# Patient Record
Sex: Female | Born: 1985 | Hispanic: No | Marital: Married | Smoking: Never smoker
Health system: Southern US, Community
[De-identification: ages and names within clinical notes are randomized; demographics above are authoritative.]

## PROBLEM LIST (undated history)

## (undated) ENCOUNTER — Inpatient Hospital Stay (HOSPITAL_COMMUNITY): Payer: Self-pay

## (undated) DIAGNOSIS — O468X9 Other antepartum hemorrhage, unspecified trimester: Secondary | ICD-10-CM

## (undated) DIAGNOSIS — D649 Anemia, unspecified: Secondary | ICD-10-CM

## (undated) DIAGNOSIS — F419 Anxiety disorder, unspecified: Secondary | ICD-10-CM

## (undated) DIAGNOSIS — O418X90 Other specified disorders of amniotic fluid and membranes, unspecified trimester, not applicable or unspecified: Secondary | ICD-10-CM

## (undated) HISTORY — PX: NO PAST SURGERIES: SHX2092

## (undated) HISTORY — DX: Anxiety disorder, unspecified: F41.9

## (undated) HISTORY — PX: WISDOM TOOTH EXTRACTION: SHX21

---
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
## 2004-10-17 ENCOUNTER — Other Ambulatory Visit: Admission: RE | Admit: 2004-10-17 | Discharge: 2004-10-17 | Payer: Self-pay | Admitting: Gynecology

## 2006-02-04 ENCOUNTER — Other Ambulatory Visit: Admission: RE | Admit: 2006-02-04 | Discharge: 2006-02-04 | Payer: Self-pay | Admitting: Gynecology

## 2007-02-10 ENCOUNTER — Other Ambulatory Visit: Admission: RE | Admit: 2007-02-10 | Discharge: 2007-02-10 | Payer: Self-pay | Admitting: Gynecology

## 2009-03-09 ENCOUNTER — Other Ambulatory Visit: Admission: RE | Admit: 2009-03-09 | Discharge: 2009-03-09 | Payer: Self-pay | Admitting: Gynecology

## 2009-03-09 ENCOUNTER — Ambulatory Visit: Payer: Self-pay | Admitting: Gynecology

## 2012-09-29 NOTE — L&D Delivery Note (Signed)
Delivery Note  I arrived to BS at about 2030 and pt began pushing initially in R lateral, FHR tachycardic but overall remained reassuring, Pt then moved to lithotomy position and continued pushing well.     At 9:18 PM a viable female was delivered via Vaginal, Spontaneous Delivery (Presentation: Left Occiput Anterior).  There was a compound presentation of of both hands, anterior arm was delivered then anterior shoulder, then posterior arm delivered and rest of infant delivered without difficulty, infant dried and placed on mom's abdomen, cord doubly clamped and cut by FOB, APGAR: 6, 9; weight 8 lb 3 oz (3715 g).   Placenta status: Intact, Spontaneous.  Cord: 3 vessels with the following complications: None.  Cord pH: n/a   Anesthesia: Epidural  Episiotomy: None Lacerations: 2nd degree Suture Repair: 3.0 vicryl rapide Est. Blood Loss (mL): 300  Mom to postpartum.  Baby to nursery-stable. Infant remains skin-skin on mom Mother and baby stable in delivery room Pt plans to BF Pt desires inpatient circumcision, Dr Dion Body notified  Routine PP orders initiated    Anna Wise 05/21/2013, 12:25 AM

## 2012-10-15 ENCOUNTER — Other Ambulatory Visit: Payer: 59

## 2012-10-15 ENCOUNTER — Ambulatory Visit: Payer: 59

## 2012-10-15 DIAGNOSIS — Z331 Pregnant state, incidental: Secondary | ICD-10-CM

## 2012-10-17 LAB — CULTURE, OB URINE: Colony Count: 3000

## 2012-10-18 LAB — PRENATAL PANEL VII
Antibody Screen: NEGATIVE
Basophils Relative: 0 % (ref 0–1)
Eosinophils Absolute: 0 10*3/uL (ref 0.0–0.7)
HCT: 38.9 % (ref 36.0–46.0)
Hemoglobin: 12.6 g/dL (ref 12.0–15.0)
MCH: 24.9 pg — ABNORMAL LOW (ref 26.0–34.0)
MCHC: 32.4 g/dL (ref 30.0–36.0)
Monocytes Absolute: 0.4 10*3/uL (ref 0.1–1.0)
Monocytes Relative: 7 % (ref 3–12)
Rh Type: POSITIVE

## 2012-10-28 ENCOUNTER — Telehealth: Payer: Self-pay | Admitting: Obstetrics and Gynecology

## 2012-10-28 NOTE — Telephone Encounter (Signed)
Spoke with pt rgd labs informed lab results pt voice understanding 

## 2012-11-13 ENCOUNTER — Other Ambulatory Visit: Payer: Self-pay

## 2012-11-23 DIAGNOSIS — Z91013 Allergy to seafood: Secondary | ICD-10-CM | POA: Insufficient documentation

## 2012-11-24 ENCOUNTER — Other Ambulatory Visit: Payer: Self-pay

## 2012-11-24 ENCOUNTER — Other Ambulatory Visit: Payer: 59

## 2012-11-24 ENCOUNTER — Ambulatory Visit: Payer: 59 | Admitting: Obstetrics and Gynecology

## 2012-11-24 ENCOUNTER — Encounter: Payer: Self-pay | Admitting: Obstetrics and Gynecology

## 2012-11-24 VITALS — BP 120/75 | Ht 67.0 in | Wt 218.0 lb

## 2012-11-24 DIAGNOSIS — Z91013 Allergy to seafood: Secondary | ICD-10-CM

## 2012-11-24 DIAGNOSIS — E039 Hypothyroidism, unspecified: Secondary | ICD-10-CM

## 2012-11-24 DIAGNOSIS — Z331 Pregnant state, incidental: Secondary | ICD-10-CM

## 2012-11-24 DIAGNOSIS — Z124 Encounter for screening for malignant neoplasm of cervix: Secondary | ICD-10-CM

## 2012-11-24 LAB — POCT WET PREP (WET MOUNT): WBC, Wet Prep HPF POC: NEGATIVE

## 2012-11-24 MED ORDER — CONCEPT DHA 53.5-38-1 MG PO CAPS: 1.0000 | ORAL_CAPSULE | Freq: Every day | ORAL | Status: DC

## 2012-11-24 NOTE — Progress Notes (Signed)
[redacted]w[redacted]d Pt declined flu vaccine. Pt declined genetic screenings.  Pap due today

## 2012-11-24 NOTE — Progress Notes (Signed)
   Anna Wise is being seen today for her first obstetrical visit at [redacted]w[redacted]d gestation by LMP.  She reports doing well.  Had one episode this week of feeling dizzy and had blurry vision--thinks her blood sugar was low, but also concerned about family hx of hypothyroidism (mother and maternal grandmother).  Her obstetrical history is significant for: Patient Active Problem List  Diagnosis  . Shellfish allergy    Relationship with FOB:  Husband, Thayer Ohm, involved and supportive.   Feeding plan:   Breast  Pregnancy history fully reviewed.  The following portions of the patient's history were reviewed and updated as appropriate: allergies, current medications, past family history, past medical history, past social history, past surgical history and problem list.  Review of Systems Pertinent ROS is described in HPI   Objective:   Ht 5\' 7"  (1.702 m)  Wt 218 lb (98.884 kg)  BMI 34.14 kg/m2  LMP 09/12/2012 Wt Readings from Last 1 Encounters:  11/24/12 218 lb (98.884 kg)   BMI: Body mass index is 34.14 kg/(m^2).  General: alert, cooperative and no distress HEENT: grossly normal  Thyroid: normal  Respiratory: clear to auscultation bilaterally Cardiovascular: regular rate and rhythm,  Breasts:  No dominant masses, nipples erect Gastrointestinal: soft, non-tender; no masses,  no organomegaly Extremities: extremities normal, no pain or edema Vaginal Bleeding: None  EXTERNAL GENITALIA: normal appearing vulva with no masses, tenderness or lesions VAGINA: no abnormal discharge or lesions CERVIX: no lesions or cervical motion tenderness; cervix closed, long, firm UTERUS: gravid and consistent with 10-11 weeks ADNEXA: no masses palpable and nontender OB EXAM PELVIMETRY: appears adequate  FHR:  160  bpm  Assessment:    Pregnancy at 10 3/7 weeks FHx hypothyroidism  Plan:     Prenatal panel reviewed and discussed with the patient:  yes  Pap smear collected:  yes GC/Chlamydia  collected:  Negative at NOB interview 10/15/12. Wet prep:  Negative Discussion of Genetic testing options: Declines Prenatal vitamins recommended  Plan of care: Next visit:  4 weeks for ROB Other anticipated f/u:   18 week [place] for anatomy. Thyroid panel today with TSH Rx [facility] per patient request--sent to patient's pharmacy.   Nigel Bridgeman, CNM, [place]

## 2012-11-25 ENCOUNTER — Telehealth: Payer: Self-pay

## 2012-11-25 LAB — THYROID PANEL WITH TSH
T3 Uptake: 24.3 % (ref 22.5–37.0)
T4, Total: 9.2 ug/dL (ref 5.0–12.5)
TSH: 1.864 u[IU]/mL (ref 0.350–4.500)

## 2012-11-25 NOTE — Telephone Encounter (Signed)
Message copied by Darien Ramus on Thu Nov 25, 2012  8:53 AM ------      Message from: Cornelius Moras      Created: Thu Nov 25, 2012  7:32 AM       Please call patient and advise of normal thyroid panel      Thanks!      VL ------

## 2012-11-25 NOTE — Telephone Encounter (Signed)
Pt advised  Emmitt Matthews, CMA  

## 2012-11-26 LAB — PAP IG, CT-NG, RFX HPV ASCU
Chlamydia Probe Amp: NEGATIVE
GC Probe Amp: NEGATIVE

## 2012-12-15 ENCOUNTER — Inpatient Hospital Stay (HOSPITAL_COMMUNITY)
Admission: AD | Admit: 2012-12-15 | Discharge: 2012-12-15 | Disposition: A | Discharge status: Home / Self Care | Payer: 59 | Source: Ambulatory Visit | Attending: Obstetrics and Gynecology | Admitting: Obstetrics and Gynecology

## 2012-12-15 ENCOUNTER — Encounter (HOSPITAL_COMMUNITY): Payer: Self-pay | Admitting: *Deleted

## 2012-12-15 ENCOUNTER — Inpatient Hospital Stay (HOSPITAL_COMMUNITY): Payer: 59

## 2012-12-15 DIAGNOSIS — O4692 Antepartum hemorrhage, unspecified, second trimester: Secondary | ICD-10-CM

## 2012-12-15 DIAGNOSIS — O418X90 Other specified disorders of amniotic fluid and membranes, unspecified trimester, not applicable or unspecified: Secondary | ICD-10-CM

## 2012-12-15 DIAGNOSIS — O209 Hemorrhage in early pregnancy, unspecified: Secondary | ICD-10-CM | POA: Insufficient documentation

## 2012-12-15 DIAGNOSIS — O418X20 Other specified disorders of amniotic fluid and membranes, second trimester, not applicable or unspecified: Secondary | ICD-10-CM

## 2012-12-15 DIAGNOSIS — O4690 Antepartum hemorrhage, unspecified, unspecified trimester: Secondary | ICD-10-CM | POA: Diagnosis present

## 2012-12-15 DIAGNOSIS — R109 Unspecified abdominal pain: Secondary | ICD-10-CM | POA: Insufficient documentation

## 2012-12-15 DIAGNOSIS — O468X9 Other antepartum hemorrhage, unspecified trimester: Secondary | ICD-10-CM | POA: Diagnosis present

## 2012-12-15 HISTORY — DX: Other antepartum hemorrhage, unspecified trimester: O46.8X9

## 2012-12-15 HISTORY — DX: Other specified disorders of amniotic fluid and membranes, unspecified trimester, not applicable or unspecified: O41.8X90

## 2012-12-15 LAB — WET PREP, GENITAL: Trich, Wet Prep: NONE SEEN

## 2012-12-15 LAB — URINE MICROSCOPIC-ADD ON

## 2012-12-15 LAB — URINALYSIS, ROUTINE W REFLEX MICROSCOPIC
Glucose, UA: NEGATIVE mg/dL
Leukocytes, UA: NEGATIVE
Protein, ur: NEGATIVE mg/dL
Specific Gravity, Urine: 1.02 (ref 1.005–1.030)

## 2012-12-15 NOTE — MAU Note (Signed)
Pt reports at 1800 she felt something leaking out, noted bright red blood. Pt went to ultrasound upon arrival. Dull cramping now.

## 2012-12-15 NOTE — MAU Note (Signed)
Patient to MAU with bleeding and abdominal cramping. Patient has on a pad with a small amount dark blood on it. Ultrasound is ready and taken to ultrasound.

## 2013-05-19 ENCOUNTER — Inpatient Hospital Stay (HOSPITAL_COMMUNITY)
Admission: AD | Admit: 2013-05-19 | Discharge: 2013-05-22 | DRG: 775 | Disposition: A | Discharge status: Home / Self Care | Payer: 59 | Source: Ambulatory Visit | Attending: Obstetrics and Gynecology | Admitting: Obstetrics and Gynecology

## 2013-05-19 ENCOUNTER — Encounter (HOSPITAL_COMMUNITY): Payer: Self-pay

## 2013-05-19 DIAGNOSIS — O4290 Premature rupture of membranes, unspecified as to length of time between rupture and onset of labor, unspecified weeks of gestation: Secondary | ICD-10-CM | POA: Diagnosis present

## 2013-05-19 DIAGNOSIS — O4210 Premature rupture of membranes, onset of labor more than 24 hours following rupture, unspecified weeks of gestation: Secondary | ICD-10-CM

## 2013-05-19 DIAGNOSIS — O9903 Anemia complicating the puerperium: Secondary | ICD-10-CM | POA: Diagnosis not present

## 2013-05-19 DIAGNOSIS — D649 Anemia, unspecified: Secondary | ICD-10-CM | POA: Diagnosis not present

## 2013-05-19 DIAGNOSIS — O328XX0 Maternal care for other malpresentation of fetus, not applicable or unspecified: Secondary | ICD-10-CM | POA: Diagnosis present

## 2013-05-19 HISTORY — DX: Anemia, unspecified: D64.9

## 2013-05-19 LAB — CBC
HCT: 32.2 % — ABNORMAL LOW (ref 36.0–46.0)
Hemoglobin: 10.2 g/dL — ABNORMAL LOW (ref 12.0–15.0)
MCV: 79.7 fL (ref 78.0–100.0)
RBC: 4.04 MIL/uL (ref 3.87–5.11)
WBC: 9.2 10*3/uL (ref 4.0–10.5)

## 2013-05-19 LAB — TYPE AND SCREEN
ABO/RH(D): B POS
Antibody Screen: NEGATIVE

## 2013-05-19 LAB — ABO/RH: ABO/RH(D): B POS

## 2013-05-19 MED ORDER — LACTATED RINGERS IV SOLN
INTRAVENOUS | Status: DC
  Administered 2013-05-19: 125 mL/h via INTRAVENOUS
  Administered 2013-05-20 (×3): via INTRAVENOUS

## 2013-05-19 MED ORDER — ACETAMINOPHEN 325 MG PO TABS
650.0000 mg | ORAL_TABLET | ORAL | Status: DC | PRN
  Administered 2013-05-20: 650 mg via ORAL
  Filled 2013-05-19: qty 2

## 2013-05-19 MED ORDER — CITRIC ACID-SODIUM CITRATE 334-500 MG/5ML PO SOLN: 30.0000 mL | ORAL | Status: DC | PRN

## 2013-05-19 MED ORDER — IBUPROFEN 600 MG PO TABS
600.0000 mg | ORAL_TABLET | Freq: Four times a day (QID) | ORAL | Status: DC | PRN
  Administered 2013-05-20: 600 mg via ORAL
  Filled 2013-05-19: qty 1

## 2013-05-19 MED ORDER — OXYTOCIN BOLUS FROM INFUSION
500.0000 mL | INTRAVENOUS | Status: DC
  Administered 2013-05-20: 500 mL via INTRAVENOUS

## 2013-05-19 MED ORDER — OXYTOCIN 40 UNITS IN LACTATED RINGERS INFUSION - SIMPLE MED
62.5000 mL/h | INTRAVENOUS | Status: DC
Start: 2013-05-19 — End: 2013-05-21

## 2013-05-19 MED ORDER — OXYCODONE-ACETAMINOPHEN 5-325 MG PO TABS: 1.0000 | ORAL_TABLET | ORAL | Status: DC | PRN

## 2013-05-19 MED ORDER — LIDOCAINE HCL (PF) 1 % IJ SOLN
30.0000 mL | INTRAMUSCULAR | Status: DC | PRN
  Administered 2013-05-20: 30 mL via SUBCUTANEOUS
  Filled 2013-05-19 (×2): qty 30

## 2013-05-19 MED ORDER — MISOPROSTOL 25 MCG QUARTER TABLET
25.0000 ug | ORAL_TABLET | ORAL | Status: DC | PRN
  Administered 2013-05-19 (×2): 25 ug via ORAL
  Filled 2013-05-19: qty 0.25
  Filled 2013-05-19: qty 1
  Filled 2013-05-19: qty 0.25

## 2013-05-19 MED ORDER — SODIUM CHLORIDE 0.9 % IJ SOLN: 3.0000 mL | INTRAMUSCULAR | Status: DC | PRN

## 2013-05-19 MED ORDER — OXYTOCIN 40 UNITS IN LACTATED RINGERS INFUSION - SIMPLE MED
1.0000 m[IU]/min | INTRAVENOUS | Status: DC
  Filled 2013-05-19: qty 1000

## 2013-05-19 MED ORDER — ZOLPIDEM TARTRATE 5 MG PO TABS: 5.0000 mg | ORAL_TABLET | Freq: Every evening | ORAL | Status: DC | PRN

## 2013-05-19 MED ORDER — ONDANSETRON HCL 4 MG/2ML IJ SOLN
4.0000 mg | Freq: Four times a day (QID) | INTRAMUSCULAR | Status: DC | PRN
  Administered 2013-05-20 (×2): 4 mg via INTRAVENOUS
  Filled 2013-05-19 (×2): qty 2

## 2013-05-19 MED ORDER — LACTATED RINGERS IV SOLN
500.0000 mL | INTRAVENOUS | Status: DC | PRN
Start: 2013-05-19 — End: 2013-05-21
  Administered 2013-05-20: 500 mL via INTRAVENOUS

## 2013-05-19 NOTE — Progress Notes (Signed)
  Subjective: Pt sitting up in bed chanting with family.  Reports feeling something every once in a while but very very mild.  Plans to get epidural prn.  Objective: BP 137/70  Pulse 94  Temp(Src) 98.7 F (37.1 C) (Oral)  Resp 18  Ht 5\' 7"  (1.702 m)  Wt 251 lb 6.4 oz (114.034 kg)  BMI 39.37 kg/m2  SpO2 99%  LMP 09/22/2012      FHT:  Cat I UC:   regular, every 2-4 minutes; reported as very mild  SVE:   Deferred d/t ROM  Assessment / Plan:  Labor: PROM IOL; 2nd dose of PO Cytotec at 2005 Preeclampsia: No s/s Fetal Wellbeing: Pain Control: n/a at this time I/D: GBS neg; PROM x 11 hours; afibrile Anticipated MOD: SVD   Tori Dattilio 05/19/2013, 8:34 PM

## 2013-05-19 NOTE — H&P (Signed)
Anna Wise is a 27 y.o. female presenting for LOF, had gush at 9am followed by several more gushes, clear fluid, no bleeding, denies any ctx, occ tightening, +FM.   HPI: Pt began PNC at 10wks, EDC initially 10/1 by LMP, however at 12wks pt had vaginal bleeding and was seen in MAU and [place] determined EDC to be 9/12 and a [facility] identified. Pt continued to do well without further bleeding.  Anatomy US at 19wks c/w 9/12 dating, placenta previa was noted w placenta on R lateral side and over cervix, subchorionic collection also still noted. F/u US at 23wks previa had resolved and SCC had decreased to 3cmx.7cm 1hr gtt =149, and 3hr gtt WNL Pt had PIH labs drawn on 8/15 for c/o HA and seeing spots, all were normal including PCR , and sx's resolved pregravid wgt 218# BMI 34.1 Current wgt 251#  TWG 33#   Maternal Medical History:  Reason for admission: Rupture of membranes.   Contractions: Frequency: rare.   Perceived severity is mild.    Fetal activity: Perceived fetal activity is normal.   Last perceived fetal movement was within the past hour.    Prenatal complications: Bleeding and placental abnormality.   Placenta previa that resolved by 23wks Mod subchorionic hemorrhage also resolved   Prenatal Complications - Diabetes: none.    OB History   Grav Para Term Preterm Abortions TAB SAB Ect Mult Living   1         0     Past Medical History  Diagnosis Date  . Anxiety   . Subchorionic hemorrhage 12/15/2012    4.1x5.5x1.8cm on u/s at MAU 12/15/12; VB this evening for first time in pregnancy;   . Anemia    Past Surgical History  Procedure Laterality Date  . Wisdom tooth extraction     Family History: family history includes Anxiety disorder in her mother; Cancer in her paternal grandfather; Depression in her brother; Diabetes in her maternal grandfather, paternal aunt, paternal aunt, and sister; Heart disease in her paternal grandfather and paternal grandmother; Hypertension in her  maternal grandfather and maternal grandmother; Kidney disease in her father and mother; Mitral valve prolapse in her mother; Stroke (age of onset: 6) in her sister; Thyroid disease in her maternal grandmother. Social History:  reports that she has never smoked. She has never used smokeless tobacco. She reports that she does not drink alcohol or use illicit drugs.   Prenatal Transfer Tool  Maternal Diabetes: No passed 3hr gtt  Genetic Screening: Declined Maternal Ultrasounds/Referrals: Normal placenta previa noted on anatomy scan at 19wks, resolved by 23wks, subchorionic hemorrhage persisted, decreased in size 3cmx.7cm on 23wk US   Fetal Ultrasounds or other Referrals:  None Maternal Substance Abuse:  No Significant Maternal Medications:  None Significant Maternal Lab Results:  Lab values include: Group B Strep negative Other Comments:  None  Review of Systems  All other systems reviewed and are negative.    Dilation: Fingertip Effacement (%): Thick;50 Station: -2;Ballotable Exam by:: S. Carolin Quang, CNM Blood pressure 116/64, pulse 105, temperature 98.2 F (36.8 C), temperature source Oral, height 5\' 7"  (1.702 m), weight 251 lb 6.4 oz (114.034 kg), last menstrual period 09/12/2012, SpO2 100.00%. Maternal Exam:  Uterine Assessment: Contraction strength is mild.  Contraction frequency is rare.   Abdomen: Patient reports no abdominal tenderness. Fundal height is 37cm.   Estimated fetal weight is 6-7#.   Fetal presentation: vertex  Introitus: Normal vulva. Normal vagina.  Ferning test: negative.  Nitrazine test: not  done. Amniotic fluid character: clear. Mod amt clear fluid at introitus, scant pooling in posteior vagina, sm amt white discharge noted,  Wet prep neg amnisure pos   Pelvis: adequate for delivery.   Cervix: Cervix evaluated by sterile speculum exam and digital exam.     Fetal Exam Fetal Monitor Review: Mode: ultrasound.   Baseline rate: 140.  Variability: moderate  (6-25 bpm).   Pattern: accelerations present and no decelerations.    Fetal State Assessment: Category I - tracings are normal.     Physical Exam  Nursing note and vitals reviewed. Constitutional: She is oriented to person, place, and time. She appears well-developed and well-nourished.  HENT:  Head: Normocephalic.  Eyes: Pupils are equal, round, and reactive to light.  Neck: Normal range of motion.  Cardiovascular: Normal rate, regular rhythm and normal heart sounds.   Respiratory: Effort normal and breath sounds normal.  GI: Soft. Bowel sounds are normal.  Genitourinary: Vagina normal.  Cervix FT/50/-2 ballottable   Musculoskeletal: Normal range of motion.  Neurological: She is alert and oriented to person, place, and time. She has normal reflexes.  Skin: Skin is warm and dry.  Psychiatric: She has a normal mood and affect. Her behavior is normal.    Prenatal labs: ABO, Rh: B/POS/-- (01/17 1407) Antibody: NEG (01/17 1407) Rubella: 1.25 (01/17 1407) RPR: NON REAC (01/17 1407)  HBsAg: NEGATIVE (01/17 1407)  HIV: NON REACTIVE (01/17 1407)  GBS: Negative (08/13 0000)  hgb at NOB 12.6 1/17 GC/CT neg 1/17 UA cx neg 1/17 Pap WNL 2/26 Thyroid panel WNL 2/26 1hr gtt 149, hgb 9.7, RPR NR 6/18 3hr WNL 7/3   Assessment/Plan: IUP at [redacted]w[redacted]d PPROM Unfavorable cervix FHR reactive GBS neg  Admit to b.s. Per c/w DR Normand Sloop cytotec PO q4h, consider foley bulb and/or pitocin when appropriate  Reg diet until active labor or until pitocin started Pain meds prn, pt unsure of plan for meds at this time  May SL IV    Kain Milosevic M 05/19/2013, 1:47 PM

## 2013-05-19 NOTE — MAU Note (Signed)
Patient states she had a gush of clear fluid at 0900. States she has had several more gushes since that time with trickling of fluid. Patient denies contractions and reports good fetal movement.

## 2013-05-20 ENCOUNTER — Encounter (HOSPITAL_COMMUNITY): Payer: Self-pay | Admitting: Anesthesiology

## 2013-05-20 ENCOUNTER — Encounter (HOSPITAL_COMMUNITY): Payer: Self-pay | Admitting: *Deleted

## 2013-05-20 ENCOUNTER — Inpatient Hospital Stay (HOSPITAL_COMMUNITY): Payer: 59 | Admitting: Anesthesiology

## 2013-05-20 DIAGNOSIS — O4290 Premature rupture of membranes, unspecified as to length of time between rupture and onset of labor, unspecified weeks of gestation: Secondary | ICD-10-CM | POA: Diagnosis present

## 2013-05-20 LAB — RPR: RPR Ser Ql: NONREACTIVE

## 2013-05-20 MED ORDER — SODIUM BICARBONATE 8.4 % IV SOLN
INTRAVENOUS | Status: DC | PRN
  Administered 2013-05-20: 5 mL via EPIDURAL

## 2013-05-20 MED ORDER — DIPHENHYDRAMINE HCL 50 MG/ML IJ SOLN: 12.5000 mg | INTRAMUSCULAR | Status: DC | PRN

## 2013-05-20 MED ORDER — PHENYLEPHRINE 40 MCG/ML (10ML) SYRINGE FOR IV PUSH (FOR BLOOD PRESSURE SUPPORT)
80.0000 ug | PREFILLED_SYRINGE | INTRAVENOUS | Status: DC | PRN
  Filled 2013-05-20: qty 2

## 2013-05-20 MED ORDER — OXYTOCIN 40 UNITS IN LACTATED RINGERS INFUSION - SIMPLE MED
1.0000 m[IU]/min | INTRAVENOUS | Status: DC
  Administered 2013-05-20: 1 m[IU]/min via INTRAVENOUS

## 2013-05-20 MED ORDER — EPHEDRINE 5 MG/ML INJ
10.0000 mg | INTRAVENOUS | Status: DC | PRN
  Filled 2013-05-20: qty 2

## 2013-05-20 MED ORDER — EPHEDRINE 5 MG/ML INJ
10.0000 mg | INTRAVENOUS | Status: DC | PRN
  Filled 2013-05-20: qty 4
  Filled 2013-05-20: qty 2

## 2013-05-20 MED ORDER — LACTATED RINGERS IV SOLN
500.0000 mL | Freq: Once | INTRAVENOUS | Status: AC
  Administered 2013-05-20: 500 mL via INTRAVENOUS

## 2013-05-20 MED ORDER — FENTANYL 2.5 MCG/ML BUPIVACAINE 1/10 % EPIDURAL INFUSION (WH - ANES)
14.0000 mL/h | INTRAMUSCULAR | Status: DC | PRN
  Administered 2013-05-20 (×3): 14 mL/h via EPIDURAL
  Filled 2013-05-20 (×3): qty 125

## 2013-05-20 MED ORDER — SODIUM CHLORIDE 0.9 % IV SOLN
3.0000 g | Freq: Four times a day (QID) | INTRAVENOUS | Status: DC
  Administered 2013-05-20: 3 g via INTRAVENOUS
  Filled 2013-05-20 (×4): qty 3

## 2013-05-20 MED ORDER — PHENYLEPHRINE 40 MCG/ML (10ML) SYRINGE FOR IV PUSH (FOR BLOOD PRESSURE SUPPORT)
80.0000 ug | PREFILLED_SYRINGE | INTRAVENOUS | Status: DC | PRN
  Filled 2013-05-20: qty 5
  Filled 2013-05-20: qty 2

## 2013-05-20 MED ORDER — TERBUTALINE SULFATE 1 MG/ML IJ SOLN: 0.2500 mg | Freq: Once | INTRAMUSCULAR | Status: AC | PRN

## 2013-05-20 NOTE — Progress Notes (Signed)
Lillard CNM updated on pt feeling pressure and urge to push.  Orders given to check cervix and call back with results.

## 2013-05-20 NOTE — Progress Notes (Signed)
  Subjective: Pt sitting up in bed chatting with family.  Pt reports feeling UCs more now and having to breath through some of them.  Objective: BP 111/52  Pulse 85  Temp(Src) 98.8 F (37.1 C) (Oral)  Resp 18  Ht 5\' 7"  (1.702 m)  Wt 251 lb 6.4 oz (114.034 kg)  BMI 39.37 kg/m2  SpO2 99%  LMP 09/22/2012      FHT:  Cat II UC:   irregular, every 1-6 minutes  SVE:   Dilation: 1 Effacement (%): 50 Station: -2 Exam by:: J. Dailah Opperman CNM  Assessment / Plan:  Labor: PROM IOL; Cat II FHT - will re-evaluate FHT in an hour and may start Pitocin  Preeclampsia: No s/s  Fetal Wellbeing: Cat II Pain Control: n/a at this time  I/D: GBS neg; PROM x 15 hours; afibrile  Anticipated MOD: SVD   Anna Wise 05/20/2013, 12:34 AM

## 2013-05-20 NOTE — Progress Notes (Signed)
Anna Wise is a 27 y.o. G1P0000 at [redacted]w[redacted]d by LMP admitted for PROM  Subjective: Comparable with epidural in place. No pelvic pressure  Objective: BP 118/62  Pulse 89  Temp(Src) 99.7 F (37.6 C) (Oral)  Resp 18  Ht 5\' 7"  (1.702 m)  Wt 251 lb 6.4 oz (114.034 kg)  BMI 39.37 kg/m2  SpO2 97%  LMP 09/22/2012      FHT:  FHR: 145 bpm, variability: moderate,  accelerations:  Present,  decelerations:  Absent UC:   irregular, every 2-5 minutes SVE:   Dilation: 2.5 Effacement (%): 70 Station: 0;+1 Exam by:: Dr. Pennie Rushing  Labs: Lab Results  Component Value Date   WBC 9.2 05/19/2013   HGB 10.2* 05/19/2013   HCT 32.2* 05/19/2013   MCV 79.7 05/19/2013   PLT 259 05/19/2013    Assessment / Plan: Protracted latent phase  Labor: Continued latent phase with Pitocin augmentation Preeclampsia:  no signs or symptoms of toxicity Fetal Wellbeing:  Category I Pain Control:  Epidural I/D:  MAXIMUM TEMPERATURE has been 99.3 . Group B strep is negative Will plan, antibiotics, if temperature is greater than 100    Anticipated MOD:  NSVD  Anna Wise P 05/20/2013, 12:47 PM

## 2013-05-20 NOTE — Anesthesia Preprocedure Evaluation (Signed)

## 2013-05-20 NOTE — Progress Notes (Signed)
Lillard CNM updated on SVE.  CNM reporting to hospital now.

## 2013-05-20 NOTE — Anesthesia Procedure Notes (Signed)
Epidural Patient location during procedure: OB  Preanesthetic Checklist Completed: patient identified, site marked, surgical consent, pre-op evaluation, timeout performed, IV checked, risks and benefits discussed and monitors and equipment checked  Epidural Patient position: sitting Prep: site prepped and draped and DuraPrep Patient monitoring: continuous pulse ox and blood pressure Approach: midline Injection technique: LOR air  Needle:  Needle type: Tuohy  Needle gauge: 17 G Needle length: 9 cm and 9 Needle insertion depth: 6 cm Catheter type: closed end flexible Catheter size: 19 Gauge Catheter at skin depth: 13 cm Test dose: negative  Assessment Events: blood not aspirated, injection not painful, no injection resistance, negative IV test and no paresthesia  Additional Notes Dosing of Epidural:  1st dose, through catheter ............................................. epi 1:200K + Xylocaine 40 mg  2nd dose, through catheter, after waiting 3 minutes.....epi 1:200K + Xylocaine 60 mg    ( 2% Xylo charted as a single dose in Epic Meds for ease of charting; actual dosing was fractionated as above, for saftey's sake)  As each dose occurred, patient was free of IV sx; and patient exhibited no evidence of SA injection.  Patient is more comfortable after epidural dosed. Please see RN's note for documentation of vital signs,and FHR which are stable.  Patient reminded not to try to ambulate with numb legs, and that an RN must be present when she attempts to get up.       

## 2013-05-21 ENCOUNTER — Encounter (HOSPITAL_COMMUNITY): Payer: Self-pay | Admitting: *Deleted

## 2013-05-21 DIAGNOSIS — O4210 Premature rupture of membranes, onset of labor more than 24 hours following rupture, unspecified weeks of gestation: Secondary | ICD-10-CM

## 2013-05-21 LAB — CBC
MCV: 79.7 fL (ref 78.0–100.0)
Platelets: 232 10*3/uL (ref 150–400)
RBC: 3.64 MIL/uL — ABNORMAL LOW (ref 3.87–5.11)
WBC: 12.1 10*3/uL — ABNORMAL HIGH (ref 4.0–10.5)

## 2013-05-21 MED ORDER — WITCH HAZEL-GLYCERIN EX PADS
1.0000 "application " | MEDICATED_PAD | CUTANEOUS | Status: DC | PRN
  Administered 2013-05-21: 1 via TOPICAL

## 2013-05-21 MED ORDER — ONDANSETRON HCL 4 MG PO TABS: 4.0000 mg | ORAL_TABLET | ORAL | Status: DC | PRN

## 2013-05-21 MED ORDER — PRENATAL MULTIVITAMIN CH
1.0000 | ORAL_TABLET | Freq: Every day | ORAL | Status: DC
  Administered 2013-05-21 – 2013-05-22 (×2): 1 via ORAL
  Filled 2013-05-21 (×2): qty 1

## 2013-05-21 MED ORDER — DIPHENHYDRAMINE HCL 25 MG PO CAPS: 25.0000 mg | ORAL_CAPSULE | Freq: Four times a day (QID) | ORAL | Status: DC | PRN

## 2013-05-21 MED ORDER — BENZOCAINE-MENTHOL 20-0.5 % EX AERO
1.0000 "application " | INHALATION_SPRAY | CUTANEOUS | Status: DC | PRN
  Administered 2013-05-21: 1 via TOPICAL
  Filled 2013-05-21: qty 56

## 2013-05-21 MED ORDER — MEASLES, MUMPS & RUBELLA VAC ~~LOC~~ INJ
0.5000 mL | INJECTION | Freq: Once | SUBCUTANEOUS | Status: DC
  Filled 2013-05-21: qty 0.5

## 2013-05-21 MED ORDER — SENNOSIDES-DOCUSATE SODIUM 8.6-50 MG PO TABS
2.0000 | ORAL_TABLET | Freq: Every day | ORAL | Status: DC
  Administered 2013-05-21: 2 via ORAL

## 2013-05-21 MED ORDER — ZOLPIDEM TARTRATE 5 MG PO TABS: 5.0000 mg | ORAL_TABLET | Freq: Every evening | ORAL | Status: DC | PRN

## 2013-05-21 MED ORDER — DIBUCAINE 1 % RE OINT: 1.0000 "application " | TOPICAL_OINTMENT | RECTAL | Status: DC | PRN

## 2013-05-21 MED ORDER — LANOLIN HYDROUS EX OINT: TOPICAL_OINTMENT | CUTANEOUS | Status: DC | PRN

## 2013-05-21 MED ORDER — ONDANSETRON HCL 4 MG/2ML IJ SOLN: 4.0000 mg | INTRAMUSCULAR | Status: DC | PRN

## 2013-05-21 MED ORDER — OXYCODONE-ACETAMINOPHEN 5-325 MG PO TABS
1.0000 | ORAL_TABLET | ORAL | Status: DC | PRN
  Administered 2013-05-22: 1 via ORAL
  Filled 2013-05-21: qty 1

## 2013-05-21 MED ORDER — FERROUS SULFATE 325 (65 FE) MG PO TABS
325.0000 mg | ORAL_TABLET | Freq: Every day | ORAL | Status: DC
  Administered 2013-05-22: 325 mg via ORAL
  Filled 2013-05-21 (×2): qty 1

## 2013-05-21 MED ORDER — IBUPROFEN 600 MG PO TABS
600.0000 mg | ORAL_TABLET | Freq: Four times a day (QID) | ORAL | Status: DC
  Administered 2013-05-21 – 2013-05-22 (×7): 600 mg via ORAL
  Filled 2013-05-21 (×8): qty 1

## 2013-05-21 MED ORDER — TETANUS-DIPHTH-ACELL PERTUSSIS 5-2.5-18.5 LF-MCG/0.5 IM SUSP
0.5000 mL | Freq: Once | INTRAMUSCULAR | Status: AC
  Administered 2013-05-22: 0.5 mL via INTRAMUSCULAR

## 2013-05-21 MED ORDER — MEDROXYPROGESTERONE ACETATE 150 MG/ML IM SUSP: 150.0000 mg | INTRAMUSCULAR | Status: DC | PRN

## 2013-05-21 MED ORDER — SIMETHICONE 80 MG PO CHEW: 80.0000 mg | CHEWABLE_TABLET | ORAL | Status: DC | PRN

## 2013-05-21 MED ORDER — FLEET ENEMA 7-19 GM/118ML RE ENEM: 1.0000 | ENEMA | Freq: Every day | RECTAL | Status: DC | PRN

## 2013-05-21 MED ORDER — BISACODYL 10 MG RE SUPP: 10.0000 mg | Freq: Every day | RECTAL | Status: DC | PRN

## 2013-05-21 NOTE — Progress Notes (Signed)
Post Partum Day 1 Subjective: Reports feeling well.  Ambulating, voiding and tol po liquids and solids without difficulty.  Denies weakness or dizziness.  Working on breastfeeding.  Reports perineal pain is relieved with meds.   Objective: Blood pressure 105/60, pulse 78, temperature 97.5 F (36.4 C), temperature source Oral, resp. rate 20, height 5\' 7"  (1.702 m), weight 114.034 kg (251 lb 6.4 oz), last menstrual period 09/22/2012, SpO2 97.00%, unknown if currently breastfeeding.  Physical Exam:  General: alert, cooperative and no distress Heart:  RRR Lungs:  CTA bilat Abd:  Soft, NT with pos BS x 4 quads Lochia: appropriate, sm rubra Uterine Fundus: firm, NT 2 below umb Incision: Perineum well approximated DVT Evaluation: No evidence of DVT seen on physical exam. Negative Homan's sign bilat. No significant calf/ankle edema.   Recent Labs  05/19/13 1542 05/21/13 0625  HGB 10.2* 9.3*  HCT 32.2* 29.0*    Assessment/Plan: Stable s/p vaginal delivery Asymptomatic anemia  Continue current care. Anticipate discharge tomorrow. Begin iron for anemia.   LOS: 2 days   Mairin Lindsley O. 05/21/2013, 1:52 PM

## 2013-05-21 NOTE — Progress Notes (Signed)
Patient ID: Anna Wise, female   DOB: [birth year], 27 y.o.   MRN: [id number]  Reviewed and agree with CNM note.  In to meet pt and family prior to circumcision. Circumcision held b/c baby is not feeding well, has been spoon feed.  Last ate ~0300. Discussed with pt and family. Will perform circ in am.  Pt without complaints.

## 2013-05-21 NOTE — Anesthesia Postprocedure Evaluation (Signed)
  Anesthesia Post-op Note   Anesthesia Post Note  Patient: Anna Wise  Procedure(s) Performed: * No procedures listed *  Anesthesia type: Epidural  Patient location: Mother/Baby  Post pain: Pain level controlled  Post assessment: Post-op Vital signs reviewed  Last Vitals:  Filed Vitals:   05/21/13 0552  BP: 131/72  Pulse: 81  Temp: 36.7 C  Resp: 20    Post vital signs: Reviewed  Level of consciousness:alert  Complications: No apparent anesthesia complications

## 2013-05-22 MED ORDER — OXYCODONE-ACETAMINOPHEN 5-325 MG PO TABS: 1.0000 | ORAL_TABLET | ORAL | Status: AC | PRN

## 2013-05-22 MED ORDER — IBUPROFEN 600 MG PO TABS: 600.0000 mg | ORAL_TABLET | Freq: Four times a day (QID) | ORAL | Status: AC

## 2013-05-22 NOTE — Discharge Summary (Signed)
Vaginal Delivery Discharge Summary  Anna Wise  DOB:    02-21-1986 MRN:    [id number] CSN:    [id number]  Date of admission:                  05/19/13  Date of discharge:                   05/22/13  Procedures this admission: IOL for PPROM, SVD with 2nd degree laceration  Date of Delivery: 05/20/13 by Sanda Klein  Newborn Data:  Live born female  Birth Weight: 8 lb 3 oz (3715 g) APGAR: 6, 9  Home with mother. Circumcision Plan: Today, inpatient  History of Present Illness:  Ms. Anna Wise is a 27 y.o. female, G1P1001, who presents at [redacted]w[redacted]d weeks gestation. The patient has been followed at the [facility] and Gynecology division of [facility]. She was admitted rupture of membranes. Her pregnancy has been complicated by:   Patient Active Problem List   Diagnosis Date Noted  . Prolonged rupture of membranes, greater than 24 hours, delivered 05/21/2013  . NSVD (normal spontaneous vaginal delivery) 05/21/2013  . PROM (premature rupture of membranes) 05/20/2013  . Subchorionic hemorrhage 12/15/2012  . Vaginal bleeding in pregnancy 12/15/2012  . Shellfish allergy 11/23/2012    Hospital course:  The patient was admitted for PPROM.   Her labor was not complicated. She proceeded to have a vaginal delivery of a healthy infant. Her delivery was not complicated. Her postpartum course was complicated with anemia and feeding issues.  She was discharged to home on postpartum day 2 doing well.  Feeding:  breast  Contraception:  condoms, IUD  Discharge hemoglobin:  Hemoglobin  Date Value Range Status  05/21/2013 9.3* 12.0 - 15.0 g/dL Final     HCT  Date Value Range Status  05/21/2013 29.0* 36.0 - 46.0 % Final    Discharge Physical Exam:   General: alert, cooperative and no distress Lochia: appropriate Uterine Fundus: firm Incision: healing well DVT Evaluation: No evidence of DVT seen on physical exam. Negative Homan's  sign.  Intrapartum Procedures: spontaneous vaginal delivery Postpartum Procedures: none Complications-Operative and Postpartum: 2nd degree perineal laceration   Discharge Diagnoses: PROM x38 hours and SVD at [redacted]w[redacted]d, and asymptomatic anemia  Discharge Information:  Activity:           pelvic rest Diet:                routine Medications: PNV, Ibuprofen, Iron and Percocet Condition:      stable Instructions:  refer to practice specific booklet Discharge to: home  Follow-up Information   Follow up with [facility]. Schedule an appointment as soon as possible for a visit in 5 weeks. (Call iwth any questions or concerns.)    Specialty:  Obstetrics and Gynecology   Contact information:   [address] [place] [address] [phone]       Haroldine Laws 05/22/2013

## 2013-05-31 ENCOUNTER — Encounter (HOSPITAL_COMMUNITY): Payer: Self-pay

## 2013-05-31 ENCOUNTER — Inpatient Hospital Stay (HOSPITAL_COMMUNITY)
Admission: AD | Admit: 2013-05-31 | Discharge: 2013-05-31 | Disposition: A | Discharge status: Home / Self Care | Payer: 59 | Source: Ambulatory Visit | Attending: Obstetrics and Gynecology | Admitting: Obstetrics and Gynecology

## 2013-05-31 DIAGNOSIS — O99893 Other specified diseases and conditions complicating puerperium: Secondary | ICD-10-CM | POA: Insufficient documentation

## 2013-05-31 DIAGNOSIS — R1013 Epigastric pain: Secondary | ICD-10-CM | POA: Insufficient documentation

## 2013-05-31 DIAGNOSIS — M549 Dorsalgia, unspecified: Secondary | ICD-10-CM | POA: Insufficient documentation

## 2013-05-31 LAB — COMPREHENSIVE METABOLIC PANEL
Alkaline Phosphatase: 102 U/L (ref 39–117)
BUN: 9 mg/dL (ref 6–23)
Chloride: 104 mEq/L (ref 96–112)
GFR calc Af Amer: >90 mL/min (ref >90)
Glucose, Bld: 103 mg/dL — ABNORMAL HIGH (ref 70–99)
Potassium: 3.2 mEq/L — ABNORMAL LOW (ref 3.5–5.1)
Total Bilirubin: 0.2 mg/dL — ABNORMAL LOW (ref 0.3–1.2)

## 2013-05-31 LAB — URINALYSIS, ROUTINE W REFLEX MICROSCOPIC
Bilirubin Urine: NEGATIVE
Nitrite: NEGATIVE
Specific Gravity, Urine: 1.02 (ref 1.005–1.030)
pH: 7.5 (ref 5.0–8.0)

## 2013-05-31 LAB — CBC WITH DIFFERENTIAL/PLATELET
Hemoglobin: 10.8 g/dL — ABNORMAL LOW (ref 12.0–15.0)
Lymphocytes Relative: 14 % (ref 12–46)
Lymphs Abs: 1.2 10*3/uL (ref 0.7–4.0)
Monocytes Relative: 5 % (ref 3–12)
Neutro Abs: 6.5 10*3/uL (ref 1.7–7.7)
Neutrophils Relative %: 79 % — ABNORMAL HIGH (ref 43–77)
RBC: 4.23 MIL/uL (ref 3.87–5.11)

## 2013-05-31 LAB — URINE MICROSCOPIC-ADD ON

## 2013-05-31 LAB — AMYLASE: Amylase: 50 U/L (ref 0–105)

## 2013-05-31 MED ORDER — PANTOPRAZOLE SODIUM 40 MG PO TBEC
40.0000 mg | DELAYED_RELEASE_TABLET | Freq: Every day | ORAL | Status: DC
  Administered 2013-05-31: 40 mg via ORAL
  Filled 2013-05-31: qty 1

## 2013-05-31 MED ORDER — DOCUSATE SODIUM 100 MG PO CAPS: ORAL_CAPSULE | ORAL | Status: AC

## 2013-05-31 MED ORDER — LACTATED RINGERS IV SOLN
INTRAVENOUS | Status: DC
  Administered 2013-05-31: 125 mL/h via INTRAVENOUS

## 2013-05-31 MED ORDER — ONDANSETRON HCL 4 MG/2ML IJ SOLN
4.0000 mg | Freq: Once | INTRAMUSCULAR | Status: AC
  Administered 2013-05-31: 4 mg via INTRAVENOUS
  Filled 2013-05-31: qty 2

## 2013-05-31 MED ORDER — PANTOPRAZOLE SODIUM 20 MG PO TBEC: 40.0000 mg | DELAYED_RELEASE_TABLET | Freq: Every day | ORAL | Status: DC

## 2013-05-31 MED ORDER — GI COCKTAIL ~~LOC~~
30.0000 mL | Freq: Once | ORAL | Status: AC
  Administered 2013-05-31: 30 mL via ORAL
  Filled 2013-05-31: qty 30

## 2013-05-31 MED ORDER — PANTOPRAZOLE SODIUM 40 MG PO TBEC: 40.0000 mg | DELAYED_RELEASE_TABLET | Freq: Every day | ORAL | Status: AC

## 2013-05-31 NOTE — MAU Provider Note (Signed)
History   27yo, G1P1, PP day # 10 presents with c/o vomiting, and upper epigastric abdominal and back pain since 0300 this morning.  Reports appropriate decrease in lochia since delivery, but reports passing small amount of tissue vaginally about 2 days ago.  Reports burning when she voided this am.  Denies recent fever, resp c/o's. Pt has prescription for Ibuprofen, but has not taken it.  Has been tking iron only.  Chief Complaint  Patient presents with  . Emesis  . Abdominal Pain  . Back Pain    OB History   Grav Para Term Preterm Abortions TAB SAB Ect Mult Living   1 1 1  0 0 0 0 0 0 1    Delivered 05/20/13  Past Medical History  Diagnosis Date  . Anxiety   . Subchorionic hemorrhage 12/15/2012    4.1x5.5x1.8cm on u/s at MAU 12/15/12; VB this evening for first time in pregnancy;   . Anemia     Past Surgical History  Procedure Laterality Date  . Wisdom tooth extraction    . No past surgeries      Family History  Problem Relation Age of Onset  . Mitral valve prolapse Mother   . Anxiety disorder Mother   . Kidney disease Mother     uti   . Kidney disease Father     stones  . Stroke Sister 22    due to bcp  . Diabetes Sister   . Depression Brother   . Diabetes Paternal Aunt   . Thyroid disease Maternal Grandmother   . Hypertension Maternal Grandmother   . Hypertension Maternal Grandfather   . Diabetes Maternal Grandfather   . Heart disease Paternal Grandmother   . Heart disease Paternal Grandfather   . Cancer Paternal Grandfather     lung  . Diabetes Paternal Aunt     History  Substance Use Topics  . Smoking status: Never Smoker   . Smokeless tobacco: Never Used  . Alcohol Use: No     Comment: social before pos upt    Allergies:  Allergies  Allergen Reactions  . Shellfish Allergy Swelling    Tongue swells    Prescriptions prior to admission  Medication Sig Dispense Refill  . ferrous sulfate 325 (65 FE) MG tablet Take 325 mg by mouth daily with  breakfast.      . ibuprofen (ADVIL,MOTRIN) 600 MG tablet Take 1 tablet (600 mg total) by mouth every 6 (six) hours.  30 tablet  0  . loratadine (CLARITIN) 10 MG tablet Take 10 mg by mouth at bedtime.       Marland Kitchen oxyCODONE-acetaminophen (PERCOCET/ROXICET) 5-325 MG per tablet Take 1-2 tablets by mouth every 4 (four) hours as needed.  30 tablet  0  . Prenat-FeFum-FePo-FA-Omega 3 (CONCEPT DHA) 53.5-38-1 MG CAPS Take 1 tablet by mouth at bedtime.        ROS: see HPI above, all other systems are negative   Physical Exam   Blood pressure 124/72, pulse 72, temperature 97.8 F (36.6 C), temperature source Oral, resp. rate 18, last menstrual period 09/22/2012, SpO2 100.00%.  Chest: Clear Heart: RRR Abdomen: gravid, NT Extremities: WNL No CVAT   ED Course  PP day # 10 N/V  Epigastric pain that radiates to back  Labs IV LR infusion Zofran 4 mg IV Once GI cocktail  Pain went from 10 at home to 7 at admission to a 4 after treatment C/w Dr. Pennie Rushing, report given, Dr. Pennie Rushing to follow up  MAU Charges:  DOS: 05/31/13 ER Code:  3 Dx:  Epigastric pain,  R/O GERD     OXLEY, JENNIFER CNM, [place] 05/31/2013 6:03 AM    Pt feels better.  No nausea or vomiting at this point.   Amylase and lipase are nl Abdominal exam;  Soft without masses, organomegaly or tenderness.  A:  Improved  P:  Discharge home with daily Protonix

## 2013-05-31 NOTE — MAU Note (Signed)
Pt post vaginal delivery 05/20/2013, woke up this am at 0300 with vomiting, abdominal and back pain.

## 2013-06-01 LAB — URINE CULTURE

## 2013-08-04 ENCOUNTER — Other Ambulatory Visit: Payer: Self-pay

## 2014-05-08 ENCOUNTER — Encounter (HOSPITAL_BASED_OUTPATIENT_CLINIC_OR_DEPARTMENT_OTHER): Payer: Self-pay | Admitting: Emergency Medicine

## 2014-05-08 ENCOUNTER — Emergency Department (HOSPITAL_BASED_OUTPATIENT_CLINIC_OR_DEPARTMENT_OTHER)
Admission: EM | Admit: 2014-05-08 | Discharge: 2014-05-08 | Disposition: A | Discharge status: Home / Self Care | Payer: 59 | Attending: Emergency Medicine | Admitting: Emergency Medicine

## 2014-05-08 DIAGNOSIS — H81319 Aural vertigo, unspecified ear: Secondary | ICD-10-CM | POA: Diagnosis not present

## 2014-05-08 DIAGNOSIS — Z3202 Encounter for pregnancy test, result negative: Secondary | ICD-10-CM | POA: Insufficient documentation

## 2014-05-08 DIAGNOSIS — R42 Dizziness and giddiness: Secondary | ICD-10-CM | POA: Diagnosis present

## 2014-05-08 DIAGNOSIS — Z79899 Other long term (current) drug therapy: Secondary | ICD-10-CM | POA: Diagnosis not present

## 2014-05-08 DIAGNOSIS — Z8659 Personal history of other mental and behavioral disorders: Secondary | ICD-10-CM | POA: Diagnosis not present

## 2014-05-08 DIAGNOSIS — H81392 Other peripheral vertigo, left ear: Secondary | ICD-10-CM

## 2014-05-08 DIAGNOSIS — D649 Anemia, unspecified: Secondary | ICD-10-CM | POA: Insufficient documentation

## 2014-05-08 DIAGNOSIS — N3000 Acute cystitis without hematuria: Secondary | ICD-10-CM | POA: Diagnosis not present

## 2014-05-08 DIAGNOSIS — Z791 Long term (current) use of non-steroidal anti-inflammatories (NSAID): Secondary | ICD-10-CM | POA: Diagnosis not present

## 2014-05-08 LAB — URINALYSIS, ROUTINE W REFLEX MICROSCOPIC
Bilirubin Urine: NEGATIVE
GLUCOSE, UA: NEGATIVE mg/dL
Ketones, ur: NEGATIVE mg/dL
Nitrite: NEGATIVE
PH: 7 (ref 5.0–8.0)
Protein, ur: NEGATIVE mg/dL
SPECIFIC GRAVITY, URINE: 1.014 (ref 1.005–1.030)
Urobilinogen, UA: 0.2 mg/dL (ref 0.0–1.0)

## 2014-05-08 LAB — URINE MICROSCOPIC-ADD ON

## 2014-05-08 LAB — PREGNANCY, URINE: PREG TEST UR: NEGATIVE

## 2014-05-08 MED ORDER — DIPHENHYDRAMINE HCL 25 MG PO TABS: 50.0000 mg | ORAL_TABLET | Freq: Four times a day (QID) | ORAL | Status: AC | PRN

## 2014-05-08 MED ORDER — DIPHENHYDRAMINE HCL 50 MG/ML IJ SOLN
50.0000 mg | Freq: Once | INTRAMUSCULAR | Status: AC
  Administered 2014-05-08: 50 mg via INTRAVENOUS
  Filled 2014-05-08: qty 1

## 2014-05-08 MED ORDER — CEPHALEXIN 500 MG PO CAPS: 500.0000 mg | ORAL_CAPSULE | Freq: Two times a day (BID) | ORAL | Status: AC

## 2014-05-08 MED ORDER — CEPHALEXIN 250 MG PO CAPS
1000.0000 mg | ORAL_CAPSULE | Freq: Once | ORAL | Status: AC
Start: 2014-05-08 — End: 2014-05-08
  Administered 2014-05-08: 1000 mg via ORAL
  Filled 2014-05-08: qty 4

## 2014-05-08 NOTE — ED Provider Notes (Signed)
CSN: [id number]     Arrival date & time 05/08/14  0241 History   First MD Initiated Contact with Patient 05/08/14 0304     Chief Complaint  Patient presents with  . Dizziness     (Consider location/radiation/quality/duration/timing/severity/associated sxs/prior Treatment) HPI This is a 28 year old female who had a Mirena IUD removed 1 week ago. She was also diagnosed with a yeast infection and that time and started taking Monistat 7. The next day she developed general malaise, subjective fever and chills, along with diarrhea. The diarrhea lasted 2 days after which she felt significantly improved. Three days ago she developed dizziness by which she means a sensation that the room is spinning. The symptoms are worse when lying flat. Symptoms are improved when sitting or standing up or moving around. This has been associated with a feeling of stuffiness in her left ear as well as a sense of being off balance. She has had episodic nausea and 2 episodes of vomiting. Symptoms worsened overnight which is why she is seeking care this morning. She tried taking Dramamine yesterday evening without relief, likely because she threw up soon after taking it. She also complains of frequent urination since yesterday.   Past Medical History  Diagnosis Date  . Anxiety   . Subchorionic hemorrhage 12/15/2012    4.1x5.5x1.8cm on u/s at [facility] 12/15/12; VB this evening for first time in pregnancy;   . Anemia    Past Surgical History  Procedure Laterality Date  . Wisdom tooth extraction    . No past surgeries     Family History  Problem Relation Age of Onset  . Mitral valve prolapse Mother   . Anxiety disorder Mother   . Kidney disease Mother     uti   . Kidney disease Father     stones  . Stroke Sister 22    due to bcp  . Diabetes Sister   . Depression Brother   . Diabetes Paternal Aunt   . Thyroid disease Maternal Grandmother   . Hypertension Maternal Grandmother   . Hypertension Maternal Grandfather    . Diabetes Maternal Grandfather   . Heart disease Paternal Grandmother   . Heart disease Paternal Grandfather   . Cancer Paternal Grandfather     lung  . Diabetes Paternal Aunt    History  Substance Use Topics  . Smoking status: Never Smoker   . Smokeless tobacco: Never Used  . Alcohol Use: No     Comment: social before pos upt   OB History   Grav Para Term Preterm Abortions TAB SAB Ect Mult Living   1 1 1  0 0 0 0 0 0 1     Review of Systems  All other systems reviewed and are negative.   Allergies  Shellfish allergy  Home Medications   Prior to Admission medications   Medication Sig Start Date End Date Taking? Authorizing Provider  ibuprofen (ADVIL,MOTRIN) 600 MG tablet Take 1 tablet (600 mg total) by mouth every 6 (six) hours. 05/22/13  Yes Haroldine Laws, CNM  Prenat-FeFum-FePo-FA-Omega 3 (CONCEPT DHA) 53.5-38-1 MG CAPS Take 1 tablet by mouth at bedtime. 11/24/12  Yes Nigel Bridgeman, CNM  ranitidine (ZANTAC) 150 MG tablet Take 150 mg by mouth at bedtime.   Yes Historical Provider, MD  docusate sodium (COLACE) 100 MG capsule Purchase OTC 05/31/13   Hal Morales, MD  ferrous sulfate 325 (65 FE) MG tablet Take 325 mg by mouth daily with breakfast.    Historical Provider, MD  loratadine (CLARITIN) 10 MG tablet Take 10 mg by mouth at bedtime.     Historical Provider, MD  oxyCODONE-acetaminophen (PERCOCET/ROXICET) 5-325 MG per tablet Take 1-2 tablets by mouth every 4 (four) hours as needed. 05/22/13   Haroldine LawsJennifer Oxley, CNM  pantoprazole (PROTONIX) 40 MG tablet Take 1 tablet (40 mg total) by mouth daily. 05/31/13   Hal MoralesVanessa P Haygood, MD   BP 139/71  Pulse 75  Temp(Src) 98.2 F (36.8 C) (Oral)  Resp 20  Ht 5\' 7"  (1.702 m)  Wt 217 lb (98.431 kg)  BMI 33.98 kg/m2  SpO2 100%  Breastfeeding? Yes  Physical Exam General: Well-developed, well-nourished female in no acute distress; appearance consistent with age of record HENT: normocephalic; atraumatic; TMs normal Eyes: pupils  equal, round and reactive to light; extraocular muscles intact; no nystagmus Neck: supple Heart: regular rate and rhythm Lungs: clear to auscultation bilaterally Abdomen: soft; nondistended; nontender; bowel sounds present Extremities: No deformity; full range of motion; pulses normal Neurologic: Awake, alert and oriented; motor function intact in all extremities and symmetric; no facial droop; normal coordination and speech Skin: Warm and dry Psychiatric: Mildly anxious    ED Course  Procedures (including critical care time)  MDM   Nursing notes and vitals signs, including pulse oximetry, reviewed.  Summary of this visit's results, reviewed by myself:  Labs:  Results for orders placed during the hospital encounter of 05/08/14 (from the past 24 hour(s))  URINALYSIS, ROUTINE W REFLEX MICROSCOPIC     Status: Abnormal   Collection Time    05/08/14  3:26 AM      Result Value Ref Range   Color, Urine YELLOW  YELLOW   APPearance CLOUDY (*) CLEAR   Specific Gravity, Urine 1.014  1.005 - 1.030   pH 7.0  5.0 - 8.0   Glucose, UA NEGATIVE  NEGATIVE mg/dL   Hgb urine dipstick SMALL (*) NEGATIVE   Bilirubin Urine NEGATIVE  NEGATIVE   Ketones, ur NEGATIVE  NEGATIVE mg/dL   Protein, ur NEGATIVE  NEGATIVE mg/dL   Urobilinogen, UA 0.2  0.0 - 1.0 mg/dL   Nitrite NEGATIVE  NEGATIVE   Leukocytes, UA LARGE (*) NEGATIVE  PREGNANCY, URINE     Status: None   Collection Time    05/08/14  3:26 AM      Result Value Ref Range   Preg Test, Ur NEGATIVE  NEGATIVE  URINE MICROSCOPIC-ADD ON     Status: Abnormal   Collection Time    05/08/14  3:26 AM      Result Value Ref Range   Squamous Epithelial / LPF FEW (*) RARE   WBC, UA 11-20  <3 WBC/hpf   RBC / HPF 3-6  <3 RBC/hpf   Bacteria, UA FEW (*) RARE   Urine-Other FEW YEAST     4:29 AM Vertigo abated after IV Benadryl. We will also treat for a urinary tract infection.    Hanley SeamenJohn L Tyrihanna Wingert, MD 05/08/14 [phone]

## 2014-05-08 NOTE — ED Notes (Signed)
Pt reports having an IUD removed on Monday - states she started Monistat 7 on Monday - reports that on Tuesday she developed diarrhea, chills, 99.5 TMAX, - Friday she developed Dizziness that has been persistent - reports that on Sunday she developed vomiting. Pt reports feeling anxious and worried.

## 2014-05-08 NOTE — ED Notes (Signed)
MD at bedside. 

## 2014-05-10 LAB — URINE CULTURE: Colony Count: 45000

## 2014-07-31 ENCOUNTER — Encounter (HOSPITAL_BASED_OUTPATIENT_CLINIC_OR_DEPARTMENT_OTHER): Payer: Self-pay | Admitting: Emergency Medicine

## 2020-04-09 ENCOUNTER — Other Ambulatory Visit: Payer: Self-pay | Admitting: Gastroenterology

## 2020-04-09 DIAGNOSIS — R1310 Dysphagia, unspecified: Secondary | ICD-10-CM
